# Patient Record
Sex: Male | Born: 1995 | Race: White | Hispanic: No | Marital: Single | Smoking: Never smoker
Health system: Southern US, Community
[De-identification: ages and names within clinical notes are randomized; demographics above are authoritative.]

## PROBLEM LIST (undated history)

## (undated) HISTORY — PX: TRANSPOSITION OF GREAT VESSELS REPAIR: SHX815

---
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
## 2014-10-04 ENCOUNTER — Other Ambulatory Visit: Payer: Self-pay

## 2014-10-04 DIAGNOSIS — F909 Attention-deficit hyperactivity disorder, unspecified type: Secondary | ICD-10-CM | POA: Insufficient documentation

## 2014-10-04 MED ORDER — AMPHETAMINE-DEXTROAMPHET ER 15 MG PO CP24: 15.0000 mg | ORAL_CAPSULE | ORAL | Status: DC

## 2014-10-22 DIAGNOSIS — L709 Acne, unspecified: Secondary | ICD-10-CM | POA: Insufficient documentation

## 2014-10-22 DIAGNOSIS — F419 Anxiety disorder, unspecified: Secondary | ICD-10-CM | POA: Insufficient documentation

## 2014-10-28 ENCOUNTER — Encounter: Payer: Self-pay | Admitting: Family Medicine

## 2014-10-28 ENCOUNTER — Ambulatory Visit (INDEPENDENT_AMBULATORY_CARE_PROVIDER_SITE_OTHER): Payer: BC Managed Care – PPO | Admitting: Family Medicine

## 2014-10-28 VITALS — BP 120/82 | HR 64 | Temp 98.4°F | Resp 16 | Ht 71.0 in | Wt 170.0 lb

## 2014-10-28 DIAGNOSIS — F909 Attention-deficit hyperactivity disorder, unspecified type: Secondary | ICD-10-CM

## 2014-10-28 DIAGNOSIS — F988 Other specified behavioral and emotional disorders with onset usually occurring in childhood and adolescence: Secondary | ICD-10-CM

## 2014-10-28 MED ORDER — AMPHETAMINE-DEXTROAMPHET ER 15 MG PO CP24: 15.0000 mg | ORAL_CAPSULE | ORAL | Status: DC

## 2014-10-28 NOTE — Progress Notes (Signed)
Patient ID: Michael Ponce, male   DOB: 03/31/1995, 19 y.o.   MRN: [id number]   Graysin Luczynski  MRN: [id number] DOB: 05/11/1995  Subjective:  HPI   1. ADD (attention deficit disorder) Patient is a 19 year old male who presents for follow up on his ADD and to get refills for his medication.  He is currently on Adderall XR 15 mg 1 daily.  The patient states that this is controlling his symptoms and he has only minimal side effects.  The patient states that when it is very cold his fingers and toes get colder quicker but further states it is not problematic.  He states his appetite good and he sleeps well.  He further said he sleeps well for someone in college.  Patient Active Problem List   Diagnosis Date Noted  . Anxiety 10/22/2014  . Acne 10/22/2014  . ADHD (attention deficit hyperactivity disorder) 10/04/2014    No past medical history on file.  History   Social History  . Marital Status: Single    Spouse Name: N/A  . Number of Children: N/A  . Years of Education: N/A   Occupational History  . Not on file.   Social History Main Topics  . Smoking status: Never Smoker   . Smokeless tobacco: Not on file  . Alcohol Use: No  . Drug Use: No  . Sexual Activity: Not on file   Other Topics Concern  . Not on file   Social History Narrative    Outpatient Prescriptions Prior to Visit  Medication Sig Dispense Refill  . amphetamine-dextroamphetamine (ADDERALL XR) 15 MG 24 hr capsule Take 1 capsule by mouth every morning. 30 capsule 0   No facility-administered medications prior to visit.    No Known Allergies  Review of Systems  Constitutional: Negative for fever, chills, weight loss, malaise/fatigue and diaphoresis.  Respiratory: Negative for cough, hemoptysis, sputum production, shortness of breath and wheezing.   Cardiovascular: Negative for chest pain, palpitations, orthopnea and leg swelling.  Neurological: Negative for dizziness, tingling, tremors, speech change,  seizures, loss of consciousness, weakness and headaches.  Psychiatric/Behavioral: Negative for depression, suicidal ideas, hallucinations, memory loss and substance abuse. The patient is not nervous/anxious and does not have insomnia.    Objective:  BP 120/82 mmHg  Pulse 64  Temp(Src) 98.4 F (36.9 C) (Oral)  Resp 16  Ht 5\' 11"  (1.803 m)  Wt 170 lb (77.111 kg)  BMI 23.72 kg/m2  Physical Exam  Constitutional: He is oriented to person, place, and time and well-developed, well-nourished, and in no distress.  HENT:  Head: Normocephalic and atraumatic.  Right Ear: External ear normal.  Left Ear: External ear normal.  Eyes: Conjunctivae are normal.  Neck: Normal range of motion.  Cardiovascular: Normal rate and regular rhythm.   Pulmonary/Chest: Effort normal and breath sounds normal.  Abdominal: Soft.  Neurological: He is alert and oriented to person, place, and time. Gait normal.  Skin: Skin is warm and dry.  Psychiatric: Mood, memory, affect and judgment normal.    Assessment and Plan :  ADD (attention deficit disorder)  in college at [employer]. Doing well on current regimen per patient. Refill 3 for Adderall.  Health maintenance  Discussed overall health today.  he does not smoke. He does not use drugs. He drinks very little alcohol and very rarely more than 1 drink at a time. He is enjoying school looking forward to career in [employer]. He has lost about 20 pounds this year  with diet and exercise changes as he was told by his cardiologist to do so. 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 We'll plan to see him back over Christmas break and see how he is doing. \I have done the exam and reviewed the above chart and it is accurate to the best of my knowledge.  Julieanne Manson MD [facility] 10/28/2014 3:45 PM

## 2014-10-30 ENCOUNTER — Encounter: Payer: Self-pay | Admitting: Family Medicine

## 2015-05-02 ENCOUNTER — Other Ambulatory Visit: Payer: Self-pay

## 2015-05-02 DIAGNOSIS — F909 Attention-deficit hyperactivity disorder, unspecified type: Secondary | ICD-10-CM

## 2015-05-02 MED ORDER — AMPHETAMINE-DEXTROAMPHET ER 15 MG PO CP24: 15.0000 mg | ORAL_CAPSULE | ORAL | Status: DC

## 2015-05-02 NOTE — Telephone Encounter (Signed)
Pt requesting refill on Adderall. Has not filled the last 3 prescriptions and now they are expired. Can you fill this since Dr. Sullivan Lone is not here? Thanks Allene Dillon, CMA

## 2015-05-12 ENCOUNTER — Telehealth: Payer: Self-pay | Admitting: Family Medicine

## 2015-05-13 NOTE — Telephone Encounter (Signed)
Pt Adderall PA was approved for 36 months starting today (05/12/14). Pharmacy was informed of this information. See scanned document.

## 2015-08-15 ENCOUNTER — Other Ambulatory Visit: Payer: Self-pay | Admitting: Family Medicine

## 2015-08-15 ENCOUNTER — Other Ambulatory Visit: Payer: Self-pay

## 2015-08-15 DIAGNOSIS — F909 Attention-deficit hyperactivity disorder, unspecified type: Secondary | ICD-10-CM

## 2015-08-15 MED ORDER — AMPHETAMINE-DEXTROAMPHET ER 15 MG PO CP24: 15.0000 mg | ORAL_CAPSULE | ORAL | Status: AC

## 2015-08-15 NOTE — Telephone Encounter (Signed)
Last refill 05/02/2015. Allene DillonEmily Drozdowski, CMA

## 2015-08-21 ENCOUNTER — Telehealth: Payer: Self-pay | Admitting: Family Medicine

## 2015-08-21 NOTE — Telephone Encounter (Signed)
Auth # [id number] expires 09/19/15 [facility]
